# Patient Record
Sex: Female | Born: 1975 | Race: White | Hispanic: No | Marital: Married
Health system: Midwestern US, Community
[De-identification: ages and names within clinical notes are randomized; demographics above are authoritative.]

## PROBLEM LIST (undated history)

## (undated) DIAGNOSIS — Z803 Family history of malignant neoplasm of breast: Secondary | ICD-10-CM

## (undated) DIAGNOSIS — Z8049 Family history of malignant neoplasm of other genital organs: Secondary | ICD-10-CM

## (undated) DIAGNOSIS — Z800 Family history of malignant neoplasm of digestive organs: Secondary | ICD-10-CM

## (undated) DIAGNOSIS — Z8042 Family history of malignant neoplasm of prostate: Secondary | ICD-10-CM

## (undated) DIAGNOSIS — N920 Excessive and frequent menstruation with regular cycle: Secondary | ICD-10-CM

## (undated) HISTORY — DX: Family history of malignant neoplasm of other genital organs: Z80.49

## (undated) HISTORY — DX: Family history of malignant neoplasm of digestive organs: Z80.0

## (undated) HISTORY — DX: Family history of malignant neoplasm of breast: Z80.3

## (undated) HISTORY — DX: Family history of malignant neoplasm of prostate: Z80.42

---

## 2007-06-14 NOTE — Op Note (Signed)
Name: Jean Blake, Jean Blake  MR #: [id number] Surgeon: Silvano Rusk, M.D.  Account #: [id number] Surgery Date: 06/14/2007  DOB: 06/04/1976  Age: 31 Location: [id number]     OPERATIVE REPORT        PROCEDURE: Repeat low transverse cesarean section.    PREOPERATIVE DIAGNOSIS: History of cesarean section, desires elective  repeat.    POSTOPERATIVE DIAGNOSIS: Term liveborn female infant, 9 pounds 3 ounces.    SURGEON: Tracey I. Willa Rough, MD.    ANESTHESIA: Spinal.    ESTIMATED BLOOD LOSS: 750 mL.    COMPLICATIONS: None.    TUBES AND DRAINS: Foley.    SPECIMENS: None.    DESCRIPTION OF PROCEDURE: After proper patient identification and consent  were obtained, the patient was taken to the operating room, where after  sufficient induction of spinal anesthesia, she was placed in the left  lateral tilt position, and her abdomen was prepped and draped in the usual  sterile fashion. A Pfannenstiel incision was made in the lower abdomen  through a previous incision and carried to the fascia. The fascia was  scored and the incision extended bilaterally. The fascia was bluntly and  sharply dissected off the rectus muscles. The rectus muscles were  separated. The peritoneum was entered bluntly. The incision was extended  superiorly and inferiorly. The Alexis retractor was then placed. The  visceral peritoneum above the lower uterine segment was then elevated with  hemostats, cut, and the incision extended bilaterally. The bladder was  bluntly and sharply dissected out of the operative field. A transverse  incision was then made in the lower uterine segment and successive scoring  was performed in the midline until the endometrial cavity was entered  revealing clear fluid. The vertex was elevated through the incision. The  mouth and nose were suctioned, and the remainder of the infant was  delivered in the usual fashion. The cord was clamped twice, cut, and the  infant handed to awaiting attendant. The placenta was then manually   extracted. The endometrial cavity was wiped with moist laparotomy pad. The  incision was closed in 2 layers, first with a running lock suture of 0  Vicryl and then with an imbricated layer. The paracolic gutters were then  lavaged clean. Reinspection of the incision revealed adequate hemostasis.  The retractors were then removed. The fascia was closed with running suture  of #1 PDS. The incision was lavaged clean. Small bleeders were coagulated  with Bovie. The skin was closed with staples. Pad, needle, and sponge  counts were correct x2. The patient went to recovery in stable condition.  The baby went to [facility].          E-Signed By  Silvano Rusk, M.D. 07/21/2007 11:28  Silvano Rusk, M.D.    cc: Silvano Rusk, M.D.        TIH/FI5; D: 06/14/2007 8:58 A; T: 06/14/2007 9:10 A; Job# [id number]

## 2007-06-14 NOTE — Op Note (Signed)
Op Notes signed by  at 07/21/07 1128                  Author: Marisue Humble, MD  Service: --  Author Type: Physician            Filed:   Date of Service: 06/14/07 0910  Status: Signed          <!--EPICS--> Name:      Jean Blake, Jean Blake MR #:      [id number]                    Surgeon:        Silvano Rusk, M.D.<BR> Account #: [id number]                  Surgery Date:   06/14/2007<BR> DOB:       Mar 20, 1976<BR> Age:       31                           Location:       [facility]<BR> <BR>                              OPERATIVE REPORT<BR> <BR> <BR> <BR> PROCEDURE: Repeat low transverse cesarean  section.<BR> <BR> PREOPERATIVE DIAGNOSIS: History of cesarean section, desires elective<BR> repeat.<BR> <BR> POSTOPERATIVE DIAGNOSIS: Term liveborn female infant, 9 pounds 3 ounces.<BR> <BR> SURGEON: Tracey I. Mouhamadou Gittleman, MD.<BR> <BR> ANESTHESIA: Spinal.<BR>  <BR> ESTIMATED BLOOD LOSS: 750 mL.<BR> <BR> COMPLICATIONS: None.<BR> <BR> TUBES AND DRAINS: Foley.<BR> <BR> SPECIMENS: None.<BR> <BR> DESCRIPTION OF PROCEDURE: After proper patient identification and consent<BR> were obtained, the patient was taken to  the operating room, where after<BR> sufficient induction of spinal anesthesia, she was placed in the left<BR> lateral tilt position, and her abdomen was prepped and draped in the usual<BR> sterile fashion. A Pfannenstiel incision was made in the lower  abdomen<BR> through a previous incision and carried to the fascia. The fascia was<BR> scored and the incision extended bilaterally. The fascia was bluntly and<BR> sharply dissected off the rectus muscles. The rectus muscles were<BR> separated. The peritoneum  was entered bluntly. The incision was extended<BR> superiorly and inferiorly. The Alexis retractor was then placed. The<BR> visceral peritoneum above the lower uterine segment was then elevated with<BR> hemostats, cut, and the incision extended bilaterally.  The bladder was<BR> bluntly and sharply dissected out of the  operative field. A transverse<BR> incision was then made in the lower uterine segment and successive scoring<BR> was performed in the midline until the endometrial cavity was entered<BR> revealing  clear fluid. The vertex was elevated through the incision. The<BR> mouth and nose were suctioned, and the remainder of the infant was<BR> delivered in the usual fashion. The cord was clamped twice, cut, and the<BR> infant handed to awaiting attendant.  The placenta was then manually<BR> extracted. The endometrial cavity was wiped with moist laparotomy pad. The<BR> incision was closed in 2 layers, first with a running lock suture of 0<BR> Vicryl and then with an imbricated layer. The paracolic gutters  were then<BR> lavaged clean. Reinspection of the incision revealed adequate hemostasis.<BR> The retractors were then removed. The fascia was closed with running suture<BR> of #1 PDS. The incision was lavaged clean. Small bleeders were coagulated<BR> with  Bovie. The skin was closed with staples. Pad, needle, and sponge<BR> counts were correct x2. The patient went to recovery in stable condition.<BR> [facility] baby went to [facility].<BR> <BR> <BR> <BR> <BR> E-Signed By<BR> Silvano Rusk, M.D. 07/21/2007  11:28<BR> Silvano Rusk, M.D.<BR> <BR> cc:   Silvano Rusk, M.D.<BR> <BR> <BR> <BR> TIH/FI5; D: 06/14/2007  8:58 A; T: 06/14/2007  9:10 A; Job# [id number]<WU> <!--EPICE-->

## 2010-01-07 LAB — HM PAP SMEAR: Pap Smear, External: NEGATIVE

## 2013-09-14 LAB — AMB POC HEMOGLOBIN (HGB): Hemoglobin (POC): 12.2

## 2013-09-14 NOTE — Patient Instructions (Signed)
Painful Menstrual Cramps: After Your Visit  Your Care Instructions  Painful menstrual cramps are very common. Many women go to the doctor because of bad cramps when they get their period.  You may have cramps in your back, thighs, and belly. You may also have diarrhea, constipation, or nausea. Some women also get dizzy.  Pain medicine and home treatment can help you feel better.  Follow-up care is a key part of your treatment and safety. Be sure to make and go to all appointments, and call your doctor if you are having problems. It's also a good idea to know your test results and keep a list of the medicines you take.  How can you care for yourself at home?  ?? Take anti-inflammatory medicines for pain. Ibuprofen (Advil, Motrin) and naproxen (Aleve) usually work better than aspirin.  ?? Be safe with medicines. Talk to your doctor or pharmacist before you take any of these medicines. They may not be safe if you take other medicines or have other health problems.  ?? Start taking the recommended dose of pain medicine as soon as you start to feel pain. Or you can start on the day before your period. Keep taking the medicine for as many days as you have cramps.  ?? If anti-inflammatory medicines don't help, try acetaminophen (Tylenol).  ?? Do not take two or more pain medicines at the same time unless the doctor told you to. Many pain medicines have acetaminophen, which is Tylenol. Too much acetaminophen (Tylenol) can be harmful.  ?? Read and follow all instructions on the label.  ?? Put a heating pad set on low or a hot water bottle on your belly. Or take a warm bath. Heat improves blood flow and may help with pain.  ?? Lie down and put a pillow under your knees. Or lie on your side and bring your knees up to your chest. This will help with any back pressure.  ?? Get at least 30 minutes of exercise on most days of the week. This improves blood flow and may decrease pain. Walking is a good choice. You also may want to do other  activities, such as running, swimming, cycling, or playing tennis or team sports.  When should you call for help?  Call 911 anytime you think you may need emergency care. For example, call if:  ?? You passed out (lost consciousness).  Call your doctor now or seek immediate medical care if:  ?? You have sudden, severe pain in your belly or pelvis.  ?? You have severe vaginal bleeding. This means that you are soaking through your usual pads or tampons every hour for 2 or more hours and passing clots of blood.  ?? You are dizzy or lightheaded, or you feel like you may faint.  Watch closely for changes in your health, and be sure to contact your doctor if:  ?? You think you may be pregnant.  ?? You have new belly or pelvic pain.  ?? You are taking pain medicine, but it is not helping.  ?? You feel weak and tired.   Where can you learn more?   Go to http://www.healthwise.net/[facility]  Enter Z243 in the search box to learn more about "Painful Menstrual Cramps: After Your Visit."   ?? 2006-2014 [employer]wise[employer]. Care instructions adapted under license by [place] (which disclaims liability or warranty for this information). This care instruction is for use with your licensed healthcare professional. If you have questions about a medical   condition or this instruction, always ask your healthcare professional. Healthwise, Incorporated disclaims any warranty or liability for your use of this information.  Content Version: 10.2.346038; Current as of: September 13, 2012

## 2013-09-14 NOTE — Progress Notes (Signed)
Jean BinderSarah P Blake is a G2 41P2002,  38 y.o. female WHITE OR CAUCASIAN whose Patient's last menstrual period was 08/25/2013 (exact date).   who presents for her annual checkup. She is having heavy bleeding, cramping and passing clots. She has also had some bleeding with IC, been going on for years, every time. Had nl end bx in 2013. We had disc Novasure in the past. She has not had prior treatment for these problems. The menorrhagia is actually getting worse. Husband is moving to [place].    With regard to the Gardisil vaccine, she is older than the FDA approved age to receive it.      Menstrual status:    Her periods are heavy, minimal to nonexistent in flow. She is using five to ten pads or tampons per day, usually regular and last 26-30 days.    She denies dysmenorrhea.    She reports no premenstrual symptoms.    Contraception:    The current method of family planning is vasectomy and She declines contraception and counseling.    Sexual history:    She  reports that she currently engages in sexual activity and has had female partners. She reports using the following method of birth control/protection: vasectomy    Medical conditions:    Since her last annual GYN exam about one year ago, she has not the following changes in her health history: none.     Pap and Mammogram History:    Her most recent Pap smear was normal, HPV neg obtained 01/07/2010.    The patient has never had a mammogram.    The patient does have a family history of breast cancer. Great aunt    History reviewed. No pertinent past medical history.  Past Surgical History   Procedure Laterality Date   ??? Hx cesarean section           Allergies: Penicillins   History     Social History   ??? Marital Status: MARRIED     Spouse Name: N/A     Number of Children: N/A   ??? Years of Education: N/A     Occupational History   ??? Not on file.     Social History Main Topics   ??? Smoking status: Never Smoker    ??? Smokeless tobacco: Never Used   ??? Alcohol Use: Yes   ??? Drug  Use: No   ??? Sexual Activity:     Partners: Male     Pharmacist, hospitalBirth Control/ Protection: None      Comment: partner with vasectomy     Other Topics Concern   ??? Not on file     Social History Narrative     Tobacco History:  reports that she has never smoked. She has never used smokeless tobacco.  Alcohol Abuse:  reports that she drinks alcohol.  Drug Abuse:  reports that she does not use illicit drugs.    There is no problem list on file for this patient.      Review of Systems - History obtained from the patient  Constitutional: negative for weight loss, fever, night sweats  HEENT: negative for hearing loss, earache, congestion, snoring, sorethroat  CV: negative for chest pain, palpitations, edema  Resp: negative for cough, shortness of breath, wheezing  GI: negative for change in bowel habits, abdominal pain, black or bloody stools  GU: negative for frequency, dysuria, hematuria, vaginal discharge  MSK: negative for back pain, joint pain, muscle pain  Breast: negative for breast lumps,  nipple discharge, galactorrhea  Skin :negative for itching, rash, hives  Neuro: negative for dizziness, headache, confusion, weakness  Psych: negative for anxiety, depression, change in mood  Heme/lymph: negative for bleeding, bruising, pallor    Physical Exam    BP 102/70    Ht 5\' 1"  (1.549 m)    Wt 115 lb (52.164 kg)    BMI 21.74 kg/m2      LMP 08/25/2013       Constitutional  ?? Appearance: well-nourished, well developed, alert, in no acute distress    HENT  ?? Head and Face: appears normal    Neck  ?? Inspection/Palpation: normal appearance, no masses or tenderness  ?? Lymph Nodes: no lymphadenopathy present  ?? Thyroid: gland size normal, nontender, no nodules or masses present on palpation    Chest  ?? Respiratory Effort: breathing normal  ?? Auscultation: normal breath sounds    Cardiovascular  ?? Heart:  ?? Auscultation: regular rate and rhythm without murmur    Breasts  ?? Inspection of Breasts: breasts symmetrical, no skin changes, no  discharge present, nipple appearance normal, no skin retraction present  ?? Palpation of Breasts and Axillae: no masses present on palpation, no breast tenderness  ?? Axillary Lymph Nodes: no lymphadenopathy present    Gastrointestinal  ?? Abdominal Examination: abdomen non-tender to palpation, normal bowel sounds, no masses present  ?? Liver and spleen: no hepatomegaly present, spleen not palpable  ?? Hernias: no hernias identified    Genitourinary  ?? External Genitalia: normal appearance for age, no discharge present, no tenderness present, no inflammatory lesions present, no masses present, no atrophy present  ?? Vagina: normal vaginal vault without central or paravaginal defects, no discharge present, no inflammatory lesions present, no masses present  ?? Bladder: non-tender to palpation  ?? Urethra: appears normal  ?? Cervix: normal   ?? Uterus: normal size, shape and consistency  ?? Adnexa: no adnexal tenderness present, no adnexal masses present  ?? Perineum: perineum within normal limits, no evidence of trauma, no rashes or skin lesions present  ?? Anus: anus within normal limits, no hemorrhoids present  ?? Inguinal Lymph Nodes: no lymphadenopathy present    Skin  ?? General Inspection: no rash, no lesions identified    Neurologic/Psychiatric  ?? Mental Status:  ?? Orientation: grossly oriented to person, place and time  ?? Mood and Affect: mood normal, affect appropriate  Results for orders placed in visit on 09/14/13   AMB POC HEMOGLOBIN (HGB)       Result Value Ref Range    Hemoglobin (POC) 12.2         .  Assessment:  Routine gynecologic examination  Her current medical status is satisfactory with no evidence of significant gynecologic issues.  Menorrhagia  PCB  Plan:  Counseled re: diet, exercise, healthy lifestyle  Return for yearly wellness visits  Pt counseled regarding co-testing for high risk HPV with pap due to PCB, ECC  Rec screening mammo next year  End bx, sonohyst   Post hyst, D&C with Novasure. Pt counseled  concerning risks of surgery. Risks disc include bleeding, transfusion, infection, readmission, abscess drainage, injury to abdominal organs including bowel, bladder, ureters, need for additional surgery, injury may not be recognized at time of surgery, and risk of death. Pt advised procedure is 80% effective in decreasing bleeding and she must continue to use contraception.     Procedure note: Endometrial biopsy    SAMMY DOUTHITT is a G2 P44,  38 y.o. female  WHITE OR CAUCASIAN Patient's last menstrual period was 08/25/2013 (exact date).  The patient has a history of The primary encounter diagnosis was Routine gynecological examination. Diagnoses of PCB (post coital bleeding) and Excessive or frequent menstruation were also pertinent to this visit. and presents for an endometrial biopsy.   Indications:   After the indications, risks, benefits, and alternatives to performing an endometrial biopsy were explained to the patient, her questions were answered and informed consent was obtained.   Procedure:  The patient was placed on the table in the dorsal lithotomy position. A bimanual exam showed the uterus to be anterior. The uterus was not enlarged.  A speculum was placed in the vagina. The cervix was visualized and prepped with zephrin. A tenaculum was not placed on the anterior lip of the cervix for traction. It was not necessary to dilate the cervix.   A pipelle was passed through the endocervical canal without difficulty. The uterus was sounded to 8 cm's. A moderate amount of tissue was returned. This tissue was placed in formalin and sent to pathology.   It was felt that an adequate sample was obtained.   The patient tolerated the procedure well and she reported mild cramping. The tenaculum and speculum were removed.   Post Procedural Status:  The patient was observed for 10 minutes after the procedure. She had mild cramping at the time of discharge. There were no complications.   The patient was discharged in  stable condition.     An Endocervical curettage was performed and a small specimen sent for pathology. Pt tolerated well.

## 2013-09-17 NOTE — Progress Notes (Signed)
Quick Note:    Notify neg  ______

## 2013-09-18 LAB — PAP IG, CT-NG, HPV 16&18,45(183194, 507815)
.: 0
CHLAMYDIA, NUC. ACID AMP, 186114: NEGATIVE
Chlamydia, Nuc. Acid Amp.: NEGATIVE
GONOCOCCUS, NUC. ACID AMP, 186122: NEGATIVE
Gonococcus, Nuc. Acid Amp.: NEGATIVE
HPV DNA Probe, High Risk: NEGATIVE
HPV, High Risk: NEGATIVE
LABCORP 019018: 0

## 2013-09-18 NOTE — Addendum Note (Signed)
Addended by: Aliene BeamsHESTER, SHERRIE D on: 09/18/2013 11:36 AM     Modules accepted: Level of Service

## 2013-09-25 ENCOUNTER — Encounter

## 2013-10-01 NOTE — Progress Notes (Signed)
Quick Note:    Pt notified of bx and ECC results.  ______

## 2013-10-03 NOTE — Telephone Encounter (Signed)
Patient called to cancel her surgery on 4/17 due to schedule conflicts.  Will call back at a later time to reschedule.

## 2016-02-26 ENCOUNTER — Other Ambulatory Visit: Payer: Self-pay | Admitting: Obstetrics and Gynecology

## 2016-02-26 DIAGNOSIS — R928 Other abnormal and inconclusive findings on diagnostic imaging of breast: Secondary | ICD-10-CM

## 2016-03-03 ENCOUNTER — Other Ambulatory Visit: Payer: Self-pay

## 2016-03-04 ENCOUNTER — Ambulatory Visit
Admission: RE | Admit: 2016-03-04 | Discharge: 2016-03-04 | Disposition: A | Discharge status: Home / Self Care | Payer: BLUE CROSS/BLUE SHIELD | Source: Ambulatory Visit | Attending: Obstetrics and Gynecology | Admitting: Obstetrics and Gynecology

## 2016-03-04 DIAGNOSIS — R928 Other abnormal and inconclusive findings on diagnostic imaging of breast: Secondary | ICD-10-CM

## 2016-03-16 ENCOUNTER — Other Ambulatory Visit: Payer: Self-pay | Admitting: Obstetrics and Gynecology

## 2016-03-16 DIAGNOSIS — Z803 Family history of malignant neoplasm of breast: Secondary | ICD-10-CM

## 2016-03-18 ENCOUNTER — Encounter: Payer: Self-pay | Admitting: Genetic Counselor

## 2016-03-19 ENCOUNTER — Encounter: Payer: Self-pay | Admitting: Genetic Counselor

## 2016-03-29 ENCOUNTER — Other Ambulatory Visit: Payer: BLUE CROSS/BLUE SHIELD

## 2016-04-26 ENCOUNTER — Other Ambulatory Visit: Payer: BLUE CROSS/BLUE SHIELD

## 2016-04-26 ENCOUNTER — Encounter: Payer: Self-pay | Admitting: Genetic Counselor

## 2016-04-26 ENCOUNTER — Ambulatory Visit (HOSPITAL_BASED_OUTPATIENT_CLINIC_OR_DEPARTMENT_OTHER): Payer: BLUE CROSS/BLUE SHIELD | Admitting: Genetic Counselor

## 2016-04-26 DIAGNOSIS — Z800 Family history of malignant neoplasm of digestive organs: Secondary | ICD-10-CM | POA: Diagnosis not present

## 2016-04-26 DIAGNOSIS — Z803 Family history of malignant neoplasm of breast: Secondary | ICD-10-CM | POA: Diagnosis not present

## 2016-04-26 DIAGNOSIS — Z801 Family history of malignant neoplasm of trachea, bronchus and lung: Secondary | ICD-10-CM

## 2016-04-26 DIAGNOSIS — Z315 Encounter for genetic counseling: Secondary | ICD-10-CM

## 2016-04-26 DIAGNOSIS — Z8042 Family history of malignant neoplasm of prostate: Secondary | ICD-10-CM | POA: Insufficient documentation

## 2016-04-26 DIAGNOSIS — Z8049 Family history of malignant neoplasm of other genital organs: Secondary | ICD-10-CM | POA: Insufficient documentation

## 2016-04-26 NOTE — Progress Notes (Addendum)
REFERRING PROVIDER: Dian Queen, MD [address], [place] [id number]  PRIMARY PROVIDER:  No primary care provider on file.  PRIMARY REASON FOR VISIT:  1. Family history of breast cancer   2. Family history of colon cancer   3. Family history of uterine cancer   4. Family history of prostate cancer      HISTORY OF PRESENT ILLNESS:   Gail Rivera, a 40 y.o. female, was seen for a [facility] cancer genetics consultation at the request of Dr. Helane Rima due to a family history of cancer.  Gail Rivera presents to clinic today to discuss the possibility of a hereditary predisposition to cancer, genetic testing, and to further clarify her future cancer risks, as well as potential cancer risks for family members.  Gail Rivera is a 40 y.o. female with no personal history of cancer.  Her mother recently was diagnosed with breast cancer and was tested for BRCA mutations and was negative.  Gail Rivera will investigate to see if there were other genes that were also looked at.  CANCER HISTORY:   No history exists.     HORMONAL RISK FACTORS:  Menarche was at age 62.  First live birth at age 34.  OCP use for approximately 0 years.  Ovaries intact: yes.  Hysterectomy: no.  Menopausal status: premenopausal.  HRT use: 0 years. Colonoscopy: no; not examined. Mammogram within the last year: yes. Number of breast biopsies: 0. Up to date with pelvic exams:  yes. Any excessive radiation exposure in the past:  no  Past Medical History:  Diagnosis Date  . Family history of breast cancer   . Family history of colon cancer   . Family history of prostate cancer   . Family history of uterine cancer     History reviewed. No pertinent surgical history.  Social History   Social History  . Marital status: Married    Spouse name: N/A  . Number of children: N/A  . Years of education: N/A   Social History Main Topics  . Smoking status: Never Smoker  . Smokeless tobacco:  Never Used  . Alcohol use 1.2 - 1.8 oz/week    2 - 3 Glasses of wine per week  . Drug use: No  . Sexual activity: Not Asked   Other Topics Concern  . None   Social History Narrative  . None     FAMILY HISTORY:  We obtained a detailed, 4-generation family history.  Significant diagnoses are listed below: Family History  Problem Relation Age of Onset  . Breast cancer Mother 54    BRCA neg  . Prostate cancer Father     dx in his 34s  . Skin cancer Brother   . Prostate cancer Paternal Uncle     2 dx in their 5s  . Colon cancer Maternal Grandmother     dx in her 60s  . Heart attack Maternal Grandfather     in his 72s  . Lung cancer Paternal Grandmother   . Uterine cancer Other     MGMs mother  . Breast cancer Other     MGMs sister  . Breast cancer Cousin     mother's maternal cousin    The patient has a son and daughter who are cancer free.  She has a brother and sister who are cancer free as well.  Her mother was diagnosed with breast cancer at 27 and had genetic testing that is reportedly negative.  Her father was  diagnosed with prostate cancer in his 60's. She thinks his Gleason score was a 6.  He has three brothers, and two brothers had prostate cancer in their 41's. His mother was a non smoker and had lung cancer.  His father died of old age.  The patient's mother had a paternal 1/2 sister who she does not keep in touch with.  Her parents are both deceased.  Her father died in his 71's from a heart attack, and her mother died in her 53's from colon cancer.  Her mother's sister had breast cancer in her 60's and she had a daughter with breast cancer in her 61's.  The patient's maternal grandmother's mother had uterine cancer.  Patient's maternal ancestors are of [place] and [place] descent, and paternal ancestors are of [place] and [place] descent. There is no reported Ashkenazi Jewish ancestry. There is no known consanguinity.  GENETIC COUNSELING ASSESSMENT: Gail Rivera  is a 40 y.o. female with a family history of cancer which is somewhat suggestive of a hereditary cancer syndrome and predisposition to cancer. We, therefore, discussed and recommended the following at today's visit.   DISCUSSION: We discussed that about 5-10% of breast cancer is hereditary with most cases due to BRCA mutations.  Her mother had BRCA testing that was reportedly negative, but she is unsure whether additional testing was performed.  We discussed that there are other genes outside of BRCA that can increase the risk for breast cancer including ATM, CHEK2 and PALB2.  We also discussed HOXB13 as a hereditary prostate cancer gene.  We reviewed the characteristics, features and inheritance patterns of hereditary cancer syndromes. We also discussed genetic testing, including the appropriate family members to test, the process of testing, insurance coverage and turn-around-time for results. We discussed the implications of a negative, positive and/or variant of uncertain significant result. We recommended Gail Rivera pursue genetic testing for the Comprehensive cancer gene panel adding on the HOXB13 gene as well for a custom panel.  The Custom Cancer Panel offered by [facility] includes sequencing and/or deletion duplication testing of the following 33 genes: APC, ATM, AXIN2, BARD1, BMPR1A, BRCA1, BRCA2, BRIP1, CDH1, CDK4, CDKN2A, CHEK2, EPCAM, FANCC, HOXB13, MLH1, MSH2, MSH6, MUTYH, NBN, PALB2, PMS2, POLD1, POLE, PTEN, RAD51C, RAD51D, SCG5/GREM1, SMAD4, STK11, TP53, VHL, and XRCC2.     [facility] will perform a benefits investigation based on Gail Rivera's family history. We discussed that if her out of pocket cost for testing is over $100, the laboratory will call and confirm whether she wants to proceed with testing.  If the out of pocket cost of testing is less than $100 she will be billed by the genetic testing laboratory.   PLAN: After considering the risks, benefits, and limitations, [name]  provided  informed consent to pursue genetic testing and the blood sample was sent to [facility] for analysis of the Custom panel that includes HOXB13 and the Comprehensive Cancer genes. Results should be available within approximately 2-3 weeks' time, at which point they will be disclosed by telephone to Ms. Pyon, as will any additional recommendations warranted by these results. Ms. Bovey will receive a summary of her genetic counseling visit and a copy of her results once available. This information will also be available in Epic. We encouraged Ms. Sundstrom to remain in contact with cancer genetics annually so that we can continuously update the family history and inform her of any changes in cancer genetics and testing that may be of benefit for her family. Ms.  Finks's questions were answered to her satisfaction today. Our contact information was provided should additional questions or concerns arise.  Lastly, we encouraged Ms. Belleau to remain in contact with cancer genetics annually so that we can continuously update the family history and inform her of any changes in cancer genetics and testing that may be of benefit for this family.   Ms.  Brocious's questions were answered to her satisfaction today. Our contact information was provided should additional questions or concerns arise. Thank you for the referral and allowing [place] to share in the care of your patient.   Trianna Lupien P. Florene Glen, [place], [facility] Certified Genetic Counselor [email or web address] phone: [phone]  The patient was seen for a total of 45 minutes in face-to-face genetic counseling.  This patient was discussed with Drs. Magrinat, Lindi Adie and/or Burr Medico who agrees with the above.    _______________________________________________________________________ For Office Staff:  Number of people involved in session: 1 Was an Intern/ student involved with case: yes Lady Deutscher

## 2016-05-14 ENCOUNTER — Encounter: Payer: Self-pay | Admitting: Genetic Counselor

## 2016-05-14 ENCOUNTER — Telehealth: Payer: Self-pay | Admitting: Genetic Counselor

## 2016-05-14 DIAGNOSIS — Z1379 Encounter for other screening for genetic and chromosomal anomalies: Secondary | ICD-10-CM | POA: Insufficient documentation

## 2016-05-14 NOTE — Telephone Encounter (Signed)
Revealed negative genetic testing.  We do not know why there is cancer in the family.  If her parents would want to undergo genetic testing, we may be able to identify that one or both have a gene that she did not inherit.  Patient voiced understanding.  She would like a copy of testing mailed to her.

## 2016-05-18 ENCOUNTER — Ambulatory Visit: Payer: Self-pay | Admitting: Genetic Counselor

## 2016-05-18 ENCOUNTER — Encounter: Payer: Self-pay | Admitting: Genetic Counselor

## 2016-05-18 DIAGNOSIS — Z8042 Family history of malignant neoplasm of prostate: Secondary | ICD-10-CM

## 2016-05-18 DIAGNOSIS — Z8049 Family history of malignant neoplasm of other genital organs: Secondary | ICD-10-CM

## 2016-05-18 DIAGNOSIS — Z803 Family history of malignant neoplasm of breast: Secondary | ICD-10-CM

## 2016-05-18 DIAGNOSIS — Z1379 Encounter for other screening for genetic and chromosomal anomalies: Secondary | ICD-10-CM

## 2016-05-18 DIAGNOSIS — Z800 Family history of malignant neoplasm of digestive organs: Secondary | ICD-10-CM

## 2016-05-18 NOTE — Progress Notes (Signed)
HPI: Ms. Mackiewicz was previously seen in the [facility] clinic due to a family history of cancer and concerns regarding a hereditary predisposition to cancer. Please refer to our prior cancer genetics clinic note for more information regarding Ms. Aguilar's medical, social and family histories, and our assessment and recommendations, at the time. Ms. Khatib recent genetic test results were disclosed to her, as were recommendations warranted by these results. These results and recommendations are discussed in more detail below.  FAMILY HISTORY:  We obtained a detailed, 4-generation family history.  Significant diagnoses are listed below: Family History  Problem Relation Age of Onset  . Breast cancer Mother 42    BRCA neg  . Prostate cancer Father     dx in his 71s  . Skin cancer Brother   . Prostate cancer Paternal Uncle     2 dx in their 72s  . Colon cancer Maternal Grandmother     dx in her 49s  . Heart attack Maternal Grandfather     in his 71s  . Lung cancer Paternal Grandmother   . Uterine cancer Other     MGMs mother  . Breast cancer Other     MGMs sister  . Breast cancer Cousin     mother's maternal cousin    The patient has a son and daughter who are cancer free.  She has a brother and sister who are cancer free as well.  Her mother was diagnosed with breast cancer at 58 and had genetic testing that is reportedly negative.  Her father was diagnosed with prostate cancer in his 13's. She thinks his Gleason score was a 6.  He has three brothers, and two brothers had prostate cancer in their 4's. His mother was a non smoker and had lung cancer.  His father died of old age.  The patient's mother had a paternal 1/2 sister who she does not keep in touch with.  Her parents are both deceased.  Her father died in his 33's from a heart attack, and her mother died in her 36's from colon cancer.  Her mother's sister had breast cancer in her 76's and she had a daughter with  breast cancer in her 60's.  The patient's maternal grandmother's mother had uterine cancer.  Patient's maternal ancestors are of [place] and [place] descent, and paternal ancestors are of [place] and [place] descent. There is no reported Ashkenazi Jewish ancestry. There is no known consanguinity.  GENETIC TEST RESULTS: At the time of Ms. Sahr's visit, we recommended she pursue genetic testing of the custom gene gene panel including the genes from the comprehensive cancer panel and HOXB13. The Custom Cancer Panel offered by [facility] includes sequencing and/or deletion duplication testing of the following 33 genes: APC, ATM, AXIN2, BARD1, BMPR1A, BRCA1, BRCA2, BRIP1, CDH1, CDK4, CDKN2A, CHEK2, EPCAM, FANCC, HOXB13, MLH1, MSH2, MSH6, MUTYH, NBN, PALB2, PMS2, POLD1, POLE, PTEN, RAD51C, RAD51D, SCG5/GREM1, SMAD4, STK11, TP53, VHL, and XRCC2.   The report date is May 12, 2016.  Genetic testing was normal, and did not reveal a deleterious mutation in these genes. The test report has been scanned into EPIC and is located under the Molecular Pathology section of the Results Review tab.   We discussed with Ms. Julius that since the current genetic testing is not perfect, it is possible there may be a gene mutation in one of these genes that current testing cannot detect, but that chance is small. We also discussed, that it is possible  that another gene that has not yet been discovered, or that we have not yet tested, is responsible for the cancer diagnoses in the family, and it is, therefore, important to remain in touch with cancer genetics in the future so that we can continue to offer Ms. Trefz the most up to date genetic testing.   CANCER SCREENING RECOMMENDATIONS: This normal result is reassuring and indicates that Ms. Mullally does not likely have an increased risk of cancer due to a mutation in one of these genes.  We, therefore, recommended  Ms. Tuley continue to follow the cancer screening  guidelines provided by her primary healthcare providers.   RECOMMENDATIONS FOR FAMILY MEMBERS: Women in this family might be at some increased risk of developing cancer, over the general population risk, simply due to the family history of cancer. We recommended women in this family have a yearly mammogram beginning at age 48, or 54 years younger than the earliest onset of cancer, an annual clinical breast exam, and perform monthly breast self-exams. Women in this family should also have a gynecological exam as recommended by their primary provider. All family members should have a colonoscopy by age 64.  Based on Ms. Dimperio's family history, we recommended her father, who was diagnosed with prostate cancer at age 44, or her mother who was diagnosed with breast cancer at 19, have genetic counseling and testing. Ms. Simmon will let us know if we can be of any assistance in coordinating genetic counseling and/or testing for this family member.   FOLLOW-UP: Lastly, we discussed with Ms. Fahey that cancer genetics is a rapidly advancing field and it is possible that new genetic tests will be appropriate for her and/or her family members in the future. We encouraged her to remain in contact with cancer genetics on an annual basis so we can update her personal and family histories and let her know of advances in cancer genetics that may benefit this family.   Our contact number was provided. Ms. Tschida's questions were answered to her satisfaction, and she knows she is welcome to call us at anytime with additional questions or concerns.   Roma Kayser, MS, [facility] Certified Genetic Counselor [email or web address]

## 2016-05-23 ENCOUNTER — Inpatient Hospital Stay: Admission: RE | Admit: 2016-05-23 | Payer: BLUE CROSS/BLUE SHIELD | Source: Ambulatory Visit

## 2016-05-23 ENCOUNTER — Ambulatory Visit
Admission: RE | Admit: 2016-05-23 | Discharge: 2016-05-23 | Disposition: A | Discharge status: Home / Self Care | Payer: BLUE CROSS/BLUE SHIELD | Source: Ambulatory Visit | Attending: Obstetrics and Gynecology | Admitting: Obstetrics and Gynecology

## 2016-05-23 DIAGNOSIS — Z803 Family history of malignant neoplasm of breast: Secondary | ICD-10-CM

## 2016-05-23 MED ORDER — GADOBENATE DIMEGLUMINE 529 MG/ML IV SOLN
10.0000 mL | Freq: Once | INTRAVENOUS | Status: AC | PRN
  Administered 2016-05-23: 10 mL via INTRAVENOUS

## 2016-10-22 ENCOUNTER — Other Ambulatory Visit: Payer: Self-pay | Admitting: Obstetrics and Gynecology

## 2016-10-22 DIAGNOSIS — N6310 Unspecified lump in the right breast, unspecified quadrant: Secondary | ICD-10-CM

## 2016-10-26 ENCOUNTER — Other Ambulatory Visit: Payer: BLUE CROSS/BLUE SHIELD

## 2016-12-10 IMAGING — MR MR BILATERAL BREAST WITHOUT AND WITH CONTRAST
8 of 12 series · 32 of 48 positions shown · IV contrast (multihance)
Comparison: Previous exam(s) in February 2016.

CLINICAL DATA: 40-year-old patient with family history of breast
cancer. Calculated lifetime risk of breast cancer is 21.6%. The
patient was recalled from screening mammogram in February 2016 for
possible right breast masses. On diagnostic evaluation with
ultrasound, 2 probable clusters of cysts were seen and a simple cyst
was seen. A follow-up right diagnostic mammogram and ultrasound was
recommended in 6 months, in September 2016.

LABS:  None obtained
EXAM:
BILATERAL BREAST MRI WITH AND WITHOUT CONTRAST
TECHNIQUE: Multiplanar, multisequence MR images of both breasts were obtained
prior to and following the intravenous administration of 10 ml of
MultiHance.

[Series 2: t2_tirm_tra ipat (a-p) · axial · 3.0mm · 0.64mm/px · 1 of 55 slices shown]
[im 1/55]
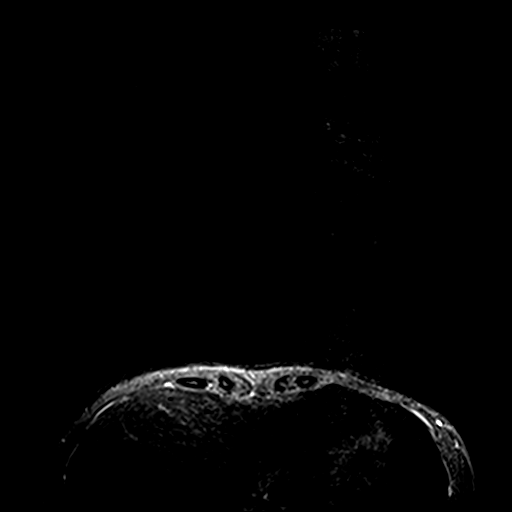

[Series 3: fl3d pre-cm no · axial · non-contrast · 1.2mm · 0.86mm/px · z∈[-82,+89]mm · 5 of 144 slices shown]
[im 1/144]
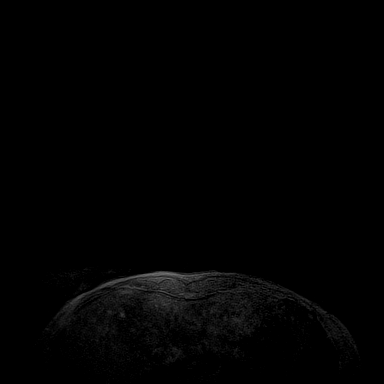
[im 36/144]
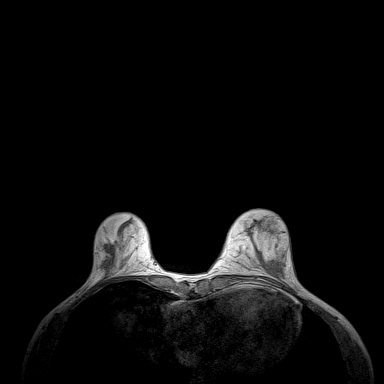
[im 72/144]
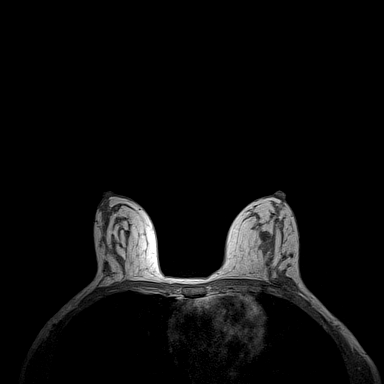
[im 108/144]
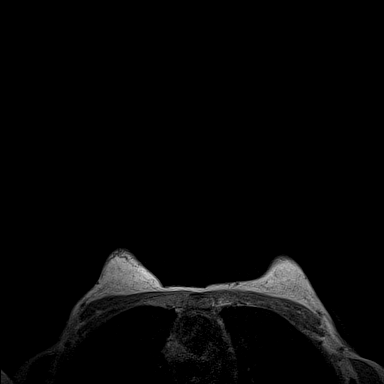
[im 144/144]
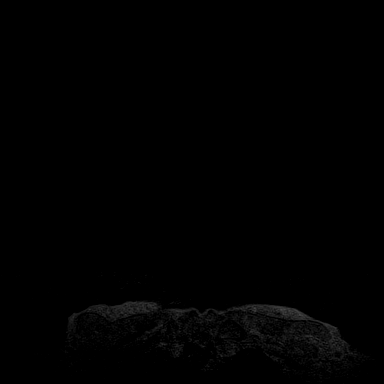

[Series 4: fl3d pre-cm · axial · non-contrast · 1.2mm · 0.86mm/px · z∈[-82,+89]mm · 5 of 144 slices shown]
[im 1/144]
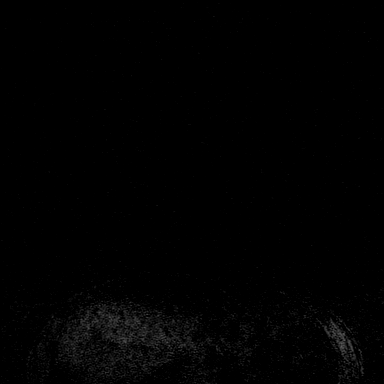
[im 36/144]
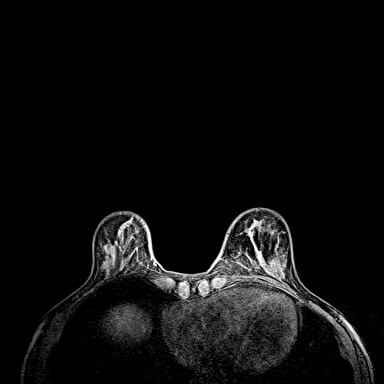
[im 72/144]
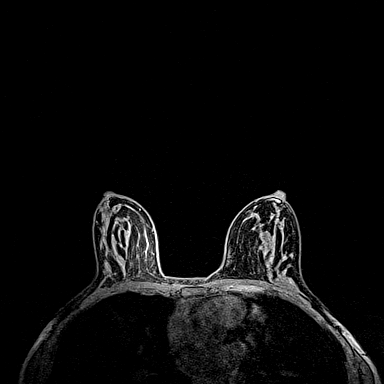
[im 108/144]
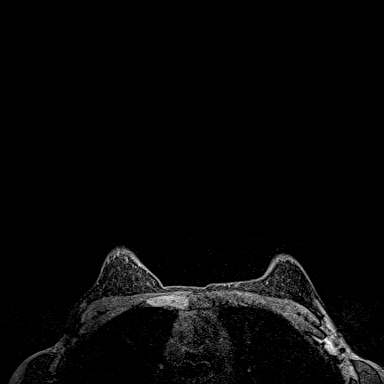
[im 144/144]
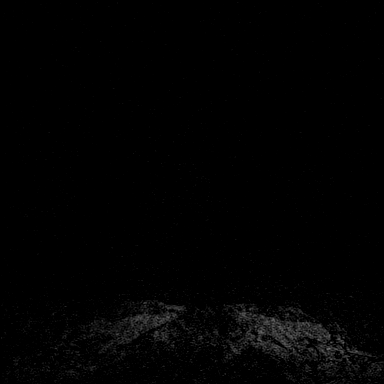

[Series 5: fl3d post-cm 20 · axial · 1.2mm · 0.86mm/px · z∈[-82,+89]mm · 5 of 144 slices shown (1 of 3)]
[im 1/144]
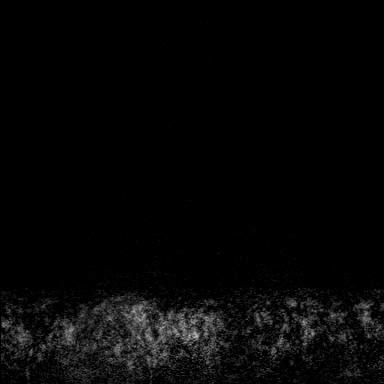
[im 36/144]
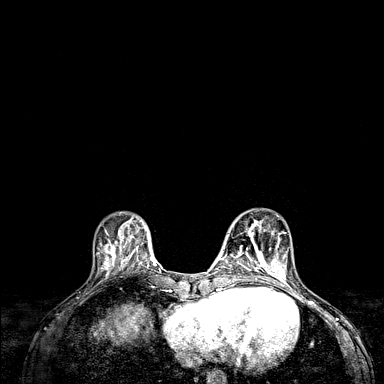
[im 72/144]
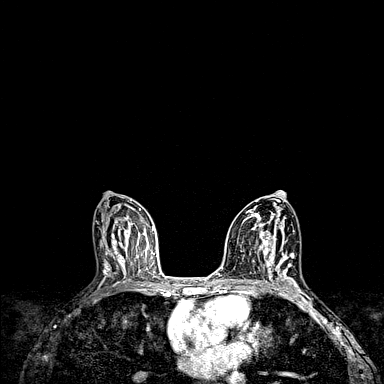
[im 108/144]
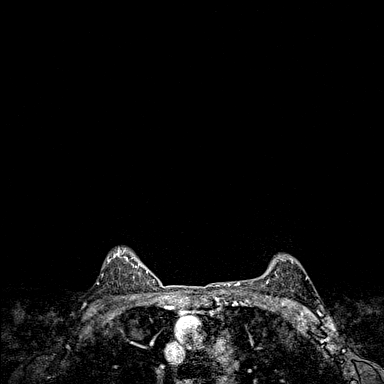
[im 144/144]
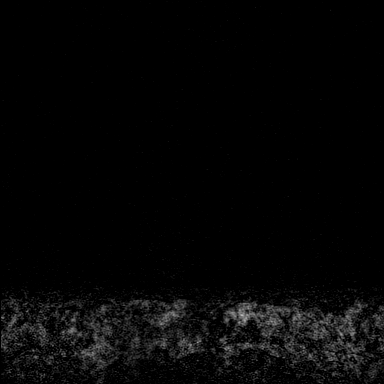

[Series 6: fl3d post-cm 20 · axial · 1.2mm · 0.86mm/px · z∈[-82,+89]mm · 5 of 144 slices shown (2 of 3)]
[im 1/144]
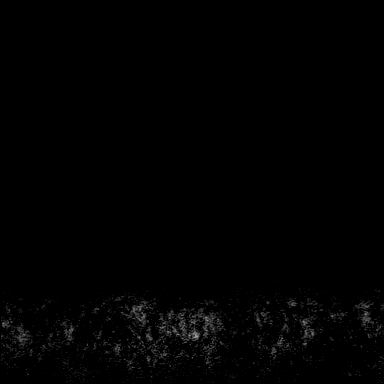
[im 36/144]
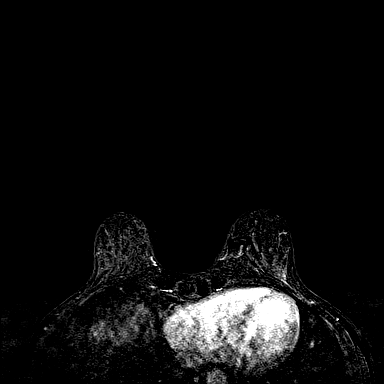
[im 72/144]
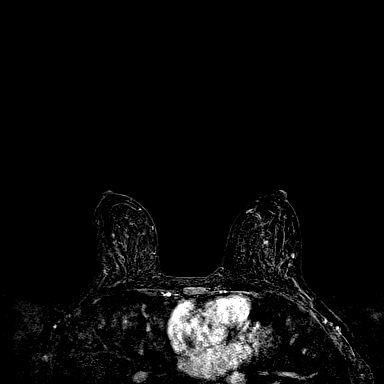
[im 108/144]
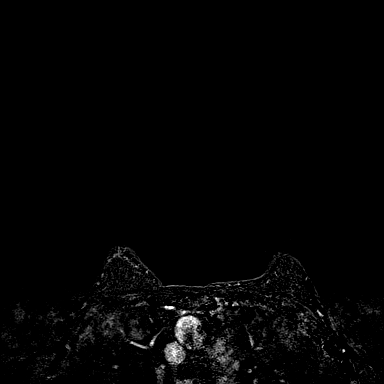
[im 144/144]
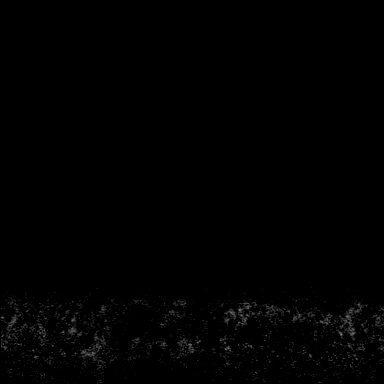

[Series 7: fl3d post-cm 20 · axial · 172.8mm · 0.86mm/px · 1 of 1 slices shown (3 of 3)]
[im 1/1]
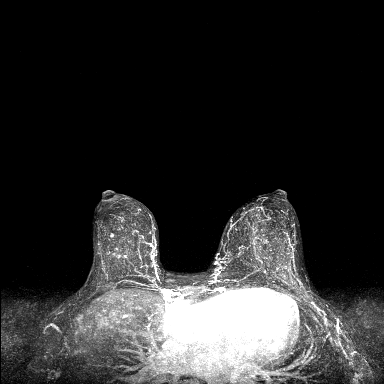

[Series 8: fl3d post-cm 3min · axial · 1.2mm · 0.86mm/px · z∈[-82,+89]mm · 6 of 144 slices shown]
[im 1/144]
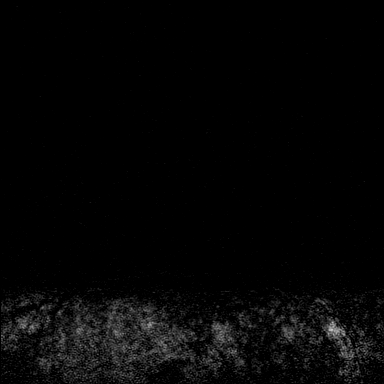
[im 29/144]
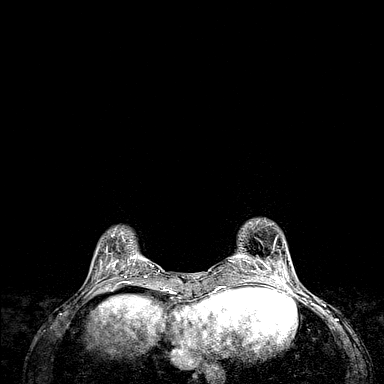
[im 58/144]
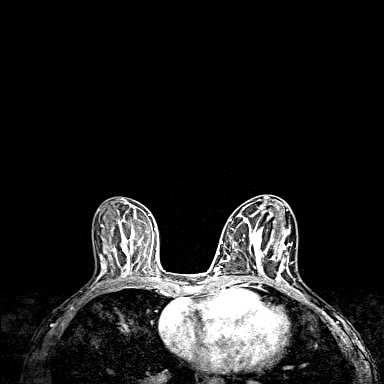
[im 86/144]
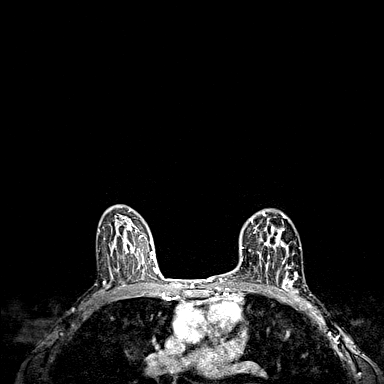
[im 115/144]
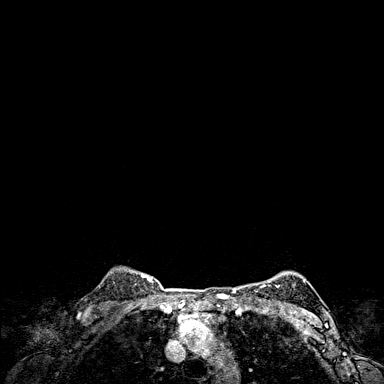
[im 144/144]
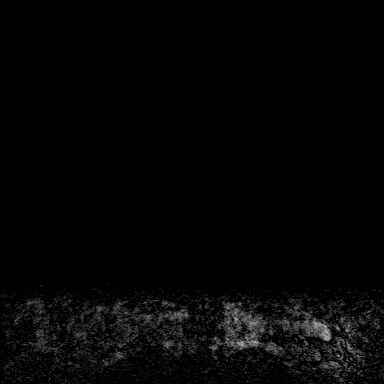

[Series 9: fl3d post-cm 3min_sub · axial · 1.2mm · 0.86mm/px · z∈[-82,+20]mm · 4 of 144 slices shown]
[im 1/144]
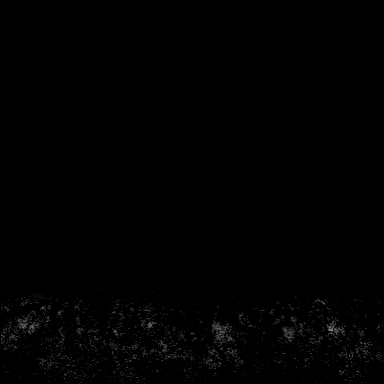
[im 29/144]
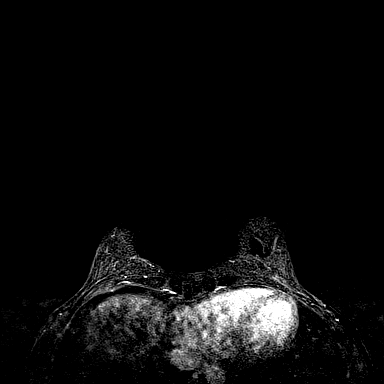
[im 58/144]
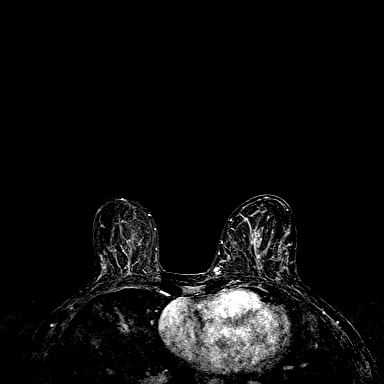
[im 86/144]
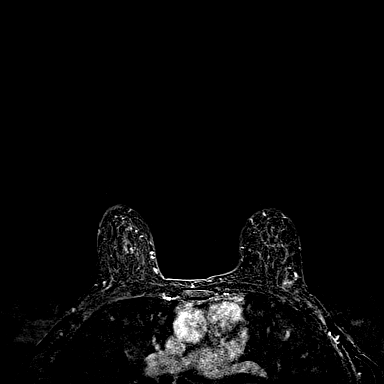

[32 of 48 positions shown; findings below may reference images not displayed]

THREE-DIMENSIONAL MR IMAGE RENDERING ON INDEPENDENT WORKSTATION:

Three-dimensional MR images were rendered by post-processing of the
original MR data on an independent workstation. The
three-dimensional MR images were interpreted, and findings are
reported in the following complete MRI report for this study. Three
dimensional images were evaluated at the independent [employer]
workstation
FINDINGS: Breast composition: c. Heterogeneous fibroglandular tissue.

Background parenchymal enhancement: Moderate.

Right breast: There are scattered circumscribed T2 bright
nonenhancing masses most consistent with cysts. The largest cyst in
the right breast is centrally located, measuring 8 mm. No enhancing
mass or abnormal enhancement.

Left breast: There are scattered circumscribed T2 bright
nonenhancing masses most consistent with cysts. The largest cyst
measures 9 mm in the central left breast. No enhancing mass or
abnormal enhancement.

Lymph nodes: No abnormal appearing lymph nodes.

Ancillary findings:  None.
IMPRESSION: No MRI evidence of malignancy in either breast. Scattered bilateral
cysts.

RECOMMENDATION:
Diagnostic right mammogram and right breast ultrasound is
recommended in September 2016, per Dr. Mann report dated March 04, 2016.

BI-RADS CATEGORY  2: Benign.

## 2016-12-31 ENCOUNTER — Other Ambulatory Visit: Payer: BLUE CROSS/BLUE SHIELD

## 2017-01-04 ENCOUNTER — Other Ambulatory Visit: Payer: BLUE CROSS/BLUE SHIELD

## 2017-01-19 ENCOUNTER — Other Ambulatory Visit: Payer: BLUE CROSS/BLUE SHIELD

## 2017-02-28 ENCOUNTER — Ambulatory Visit
Admission: RE | Admit: 2017-02-28 | Discharge: 2017-02-28 | Disposition: A | Discharge status: Home / Self Care | Payer: BLUE CROSS/BLUE SHIELD | Source: Ambulatory Visit | Attending: Obstetrics and Gynecology | Admitting: Obstetrics and Gynecology

## 2017-02-28 DIAGNOSIS — N6310 Unspecified lump in the right breast, unspecified quadrant: Secondary | ICD-10-CM

## 2017-03-08 ENCOUNTER — Other Ambulatory Visit: Payer: Self-pay | Admitting: Obstetrics and Gynecology

## 2017-03-08 DIAGNOSIS — N6001 Solitary cyst of right breast: Secondary | ICD-10-CM

## 2017-03-08 DIAGNOSIS — N6002 Solitary cyst of left breast: Principal | ICD-10-CM

## 2017-05-05 ENCOUNTER — Inpatient Hospital Stay
Admission: RE | Admit: 2017-05-05 | Discharge: 2017-05-05 | Disposition: A | Discharge status: Home / Self Care | Payer: BLUE CROSS/BLUE SHIELD | Source: Ambulatory Visit | Attending: Obstetrics and Gynecology | Admitting: Obstetrics and Gynecology

## 2017-08-09 ENCOUNTER — Ambulatory Visit
Admission: RE | Admit: 2017-08-09 | Discharge: 2017-08-09 | Disposition: A | Discharge status: Home / Self Care | Payer: BLUE CROSS/BLUE SHIELD | Source: Ambulatory Visit | Attending: Obstetrics and Gynecology | Admitting: Obstetrics and Gynecology

## 2017-08-09 DIAGNOSIS — N6002 Solitary cyst of left breast: Principal | ICD-10-CM

## 2017-08-09 DIAGNOSIS — N6001 Solitary cyst of right breast: Secondary | ICD-10-CM

## 2017-08-09 MED ORDER — GADOBENATE DIMEGLUMINE 529 MG/ML IV SOLN
9.0000 mL | Freq: Once | INTRAVENOUS | Status: AC | PRN
  Administered 2017-08-09: 9 mL via INTRAVENOUS

## 2017-08-15 ENCOUNTER — Other Ambulatory Visit: Payer: Self-pay | Admitting: Obstetrics and Gynecology

## 2017-08-15 DIAGNOSIS — N6320 Unspecified lump in the left breast, unspecified quadrant: Secondary | ICD-10-CM

## 2017-08-18 ENCOUNTER — Inpatient Hospital Stay: Admission: RE | Admit: 2017-08-18 | Payer: BLUE CROSS/BLUE SHIELD | Source: Ambulatory Visit

## 2017-09-14 ENCOUNTER — Other Ambulatory Visit: Payer: BLUE CROSS/BLUE SHIELD

## 2017-10-07 ENCOUNTER — Other Ambulatory Visit: Payer: BLUE CROSS/BLUE SHIELD

## 2017-10-19 ENCOUNTER — Other Ambulatory Visit: Payer: BLUE CROSS/BLUE SHIELD
